# Patient Record
Sex: Female | Born: 2005 | Race: Black or African American | Hispanic: No | Marital: Single | Smoking: Never smoker
Health system: Southern US, Community
[De-identification: ages and names within clinical notes are randomized; demographics above are authoritative.]

## PROBLEM LIST (undated history)

## (undated) ENCOUNTER — Inpatient Hospital Stay: Payer: Self-pay

## (undated) DIAGNOSIS — R519 Headache, unspecified: Secondary | ICD-10-CM

## (undated) DIAGNOSIS — Z789 Other specified health status: Secondary | ICD-10-CM

## (undated) DIAGNOSIS — B999 Unspecified infectious disease: Secondary | ICD-10-CM

## (undated) HISTORY — DX: Other specified health status: Z78.9

## (undated) HISTORY — PX: OTHER SURGICAL HISTORY: SHX169

## (undated) HISTORY — DX: Headache, unspecified: R51.9

## (undated) HISTORY — DX: Unspecified infectious disease: B99.9

## (undated) HISTORY — PX: NO PAST SURGERIES: SHX2092

---
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
## 2024-01-02 ENCOUNTER — Ambulatory Visit: Admitting: Physician Assistant

## 2024-01-02 VITALS — BP 122/69 | HR 104 | Temp 97.1°F | Wt 134.4 lb

## 2024-01-02 DIAGNOSIS — Z3483 Encounter for supervision of other normal pregnancy, third trimester: Secondary | ICD-10-CM

## 2024-01-02 DIAGNOSIS — Z3A29 29 weeks gestation of pregnancy: Secondary | ICD-10-CM

## 2024-01-02 DIAGNOSIS — O99013 Anemia complicating pregnancy, third trimester: Secondary | ICD-10-CM

## 2024-01-02 NOTE — Progress Notes (Signed)
Patient here for MH RV at 29 weeks. Burt Knack, RN

## 2024-01-02 NOTE — Progress Notes (Signed)
  [facility] [address] [place] [address] Main phone: [phone]  Prenatal Visit  Subjective:  Robin Silva is a 18 y.o. G2P1001 at [redacted]w[redacted]d being seen today for ongoing prenatal care.  She is currently monitored for the following issues for this low-risk pregnancy:   Patient Active Problem List   Diagnosis Date Noted   Positive depression screening 12/19/2023   Bilateral ovarian simple cysts on 08/25/23 u/s 6.6 cm 11/18/2023   Prenatal care, subsequent pregnancy 11/12/2023   Pica ice 11/12/2023   Late prenatal care 21 5/7 11/12/2023   UTI (urinary tract infection) during pregnancy 08/25/23 E. Coli 11/12/2023   Anemia affecting pregnancy 11/12/2023   Postpartum hemorrhage EBL 700 mL 07/25/22 09/04/2022   Patient reports no complaints.  Contractions: Not present. Vag. Bleeding: None.  Movement: Present. Denies leaking of fluid/ROM.   The following portions of the patient's history were reviewed and updated as appropriate: allergies, current medications, past family history, past medical history, past social history, past surgical history and problem list. Problem list updated.  Objective:   Vitals:   01/02/24 1125  BP: 122/69  Pulse: (!) 104  Temp: (!) 97.1 F (36.2 C)  Weight: 134 lb 6.4 oz (61 kg)    Fetal Status: Fetal Heart Rate (bpm): 148 Fundal Height: 27 cm Movement: Present     General:  Alert, oriented and cooperative. Patient is in no acute distress.  Skin: Skin is warm and dry. No rash noted.   Cardiovascular: Normal heart rate noted  Respiratory: Normal respiratory effort, no problems with respiration noted  Abdomen: Soft, gravid, appropriate for gestational age.  Pain/Pressure: Absent     Pelvic: Cervical exam deferred        Extremities: Normal range of motion.  Edema: None  Mental Status: Normal mood and affect. Normal behavior. Normal judgment and thought content.   Assessment and  Plan:  Pregnancy: G2P1001 at [redacted]w[redacted]d  1. [redacted] weeks gestation of pregnancy RV in 2 weeks.  2. Prenatal care, subsequent pregnancy in third trimester (Primary) Reviewed 11/15/23 fetal anat scan with pt. Pt desires [place] to complete views of cord insertion, bilat hands, L leg. [place] ordered to complete these views. - US OB Comp + 14 Wk; Future  3. Anemia affecting pregnancy in third trimester Continue oral iron supplement and iron-rich foods. Plan to recheck hgb at RV.   Preterm labor symptoms and general obstetric precautions including but not limited to vaginal bleeding, contractions, leaking of fluid and fetal movement were reviewed in detail with the patient. Please refer to After Visit Summary for other counseling recommendations.  Return in about 2 weeks (around 01/16/2024) for Routine prenatal care.  Future Appointments  Date Time Provider Department Center  01/16/2024  1:40 PM [facility] PROVIDER [facility] None    Landry Dyke, PA-C

## 2024-01-16 ENCOUNTER — Ambulatory Visit: Admitting: Family Medicine

## 2024-01-16 ENCOUNTER — Encounter: Payer: Self-pay | Admitting: Family Medicine

## 2024-01-16 VITALS — BP 91/48 | HR 89 | Temp 97.5°F | Wt 134.4 lb

## 2024-01-16 DIAGNOSIS — Z3A31 31 weeks gestation of pregnancy: Secondary | ICD-10-CM

## 2024-01-16 DIAGNOSIS — O99013 Anemia complicating pregnancy, third trimester: Secondary | ICD-10-CM

## 2024-01-16 DIAGNOSIS — Z3483 Encounter for supervision of other normal pregnancy, third trimester: Secondary | ICD-10-CM

## 2024-01-16 LAB — HEMOGLOBIN, FINGERSTICK: Hemoglobin: 9.1 g/dL — ABNORMAL LOW (ref 11.1–15.9)

## 2024-01-16 NOTE — Progress Notes (Signed)
Patient here for MH RV at 31 weeks. Kick counts reviewed and cards given.Burt Knack, RN

## 2024-01-16 NOTE — Progress Notes (Signed)
  [facility] [address], [address] [place] [address] Main phone: [phone]  Prenatal Visit  Subjective:  Robin Silva is a 18 y.o. G2P1001 at [redacted]w[redacted]d being seen today for ongoing prenatal care.  She is currently monitored for the following issues for this low-risk pregnancy:   Patient Active Problem List   Diagnosis Date Noted   Positive depression screening 12/19/2023   Bilateral ovarian simple cysts on 08/25/23 u/s 6.6 cm 11/18/2023   Prenatal care, subsequent pregnancy 11/12/2023   Pica ice 11/12/2023   Late prenatal care 21 5/7 11/12/2023   UTI (urinary tract infection) during pregnancy 08/25/23 E. Coli 11/12/2023   Anemia affecting pregnancy 11/12/2023   Postpartum hemorrhage EBL 700 mL 07/25/22 09/04/2022   Patient reports no complaints.  Contractions: Irregular. Vag. Bleeding: None.  Movement: Present. Denies leaking of fluid/ROM.   The following portions of the patient's history were reviewed and updated as appropriate: allergies, current medications, past family history, past medical history, past social history, past surgical history and problem list. Problem list updated.  Objective:   Vitals:   01/16/24 1345  BP: (!) 91/48  Pulse: 89  Temp: (!) 97.5 F (36.4 C)  Weight: 134 lb 6.4 oz (61 kg)    Fetal Status: Fetal Heart Rate (bpm): 148 Fundal Height: 29 cm Movement: Present     General:  Alert, oriented and cooperative. Patient is in no acute distress.  Skin: Skin is warm and dry. No rash noted.   Cardiovascular: Normal heart rate noted  Respiratory: Normal respiratory effort, no problems with respiration noted  Abdomen: Soft, gravid, appropriate for gestational age.  Pain/Pressure: Absent     Pelvic: Cervical exam deferred        Extremities: Normal range of motion.  Edema: None  Mental Status: Normal mood and affect. Normal behavior. Normal judgment and thought content.   Assessment and  Plan:  Pregnancy: G2P1001 at [redacted]w[redacted]d  1. [redacted] weeks gestation of pregnancy -follow up US not scheduled- RN to schedule with patient today   Signs and symptoms of preeclampsia were verbally reviewed with warning signs, when and how to call. A written handout with tips on how to take your blood pressure, as well as warning signs was provided to the patient.    2. Prenatal care, subsequent pregnancy in third trimester (Primary) -taking PNV daily  -reports very strong braxton hicks contractions- counseled on comfort measures   3. Anemia affecting pregnancy in third trimester -repeat Hgb today = 9.1 -Heme consult placed today     Preterm labor symptoms and general obstetric precautions including but not limited to vaginal bleeding, contractions, leaking of fluid and fetal movement were reviewed in detail with the patient. Please refer to After Visit Summary for other counseling recommendations.  Return in about 2 weeks (around 01/30/2024) for Routine Prenatal Care.  No future appointments.  Lenice Llamas, [place]

## 2024-01-21 ENCOUNTER — Inpatient Hospital Stay

## 2024-01-21 ENCOUNTER — Encounter: Payer: Self-pay | Admitting: Oncology

## 2024-01-21 ENCOUNTER — Inpatient Hospital Stay: Attending: Oncology | Admitting: Oncology

## 2024-01-21 VITALS — BP 96/62 | HR 118 | Temp 98.4°F | Resp 16 | Ht 64.06 in | Wt 137.0 lb

## 2024-01-21 DIAGNOSIS — D509 Iron deficiency anemia, unspecified: Secondary | ICD-10-CM | POA: Diagnosis present

## 2024-01-21 DIAGNOSIS — R635 Abnormal weight gain: Secondary | ICD-10-CM | POA: Insufficient documentation

## 2024-01-21 DIAGNOSIS — D649 Anemia, unspecified: Secondary | ICD-10-CM

## 2024-01-21 DIAGNOSIS — Z331 Pregnant state, incidental: Secondary | ICD-10-CM | POA: Insufficient documentation

## 2024-01-21 DIAGNOSIS — O99013 Anemia complicating pregnancy, third trimester: Secondary | ICD-10-CM

## 2024-01-21 DIAGNOSIS — Z79899 Other long term (current) drug therapy: Secondary | ICD-10-CM | POA: Insufficient documentation

## 2024-01-21 LAB — CBC WITH DIFFERENTIAL/PLATELET
Abs Immature Granulocytes: 0.09 10*3/uL — ABNORMAL HIGH (ref 0.00–0.07)
Basophils Absolute: 0 10*3/uL (ref 0.0–0.1)
Basophils Relative: 1 %
Eosinophils Absolute: 0.1 10*3/uL (ref 0.0–0.5)
Eosinophils Relative: 2 %
HCT: 31.2 % — ABNORMAL LOW (ref 36.0–46.0)
Hemoglobin: 10.3 g/dL — ABNORMAL LOW (ref 12.0–15.0)
Immature Granulocytes: 2 %
Lymphocytes Relative: 24 %
Lymphs Abs: 1.5 10*3/uL (ref 0.7–4.0)
MCH: 30.9 pg (ref 26.0–34.0)
MCHC: 33 g/dL (ref 30.0–36.0)
MCV: 93.7 fL (ref 80.0–100.0)
Monocytes Absolute: 0.9 10*3/uL (ref 0.1–1.0)
Monocytes Relative: 14 %
Neutro Abs: 3.6 10*3/uL (ref 1.7–7.7)
Neutrophils Relative %: 57 %
Platelets: 156 10*3/uL (ref 150–400)
RBC: 3.33 MIL/uL — ABNORMAL LOW (ref 3.87–5.11)
RDW: 12.3 % (ref 11.5–15.5)
WBC: 6.1 10*3/uL (ref 4.0–10.5)
nRBC: 0 % (ref 0.0–0.2)

## 2024-01-21 LAB — IRON AND TIBC
Iron: 94 ug/dL (ref 28–170)
Saturation Ratios: 17 % (ref 10.4–31.8)
TIBC: 567 ug/dL — ABNORMAL HIGH (ref 250–450)
UIBC: 473 ug/dL

## 2024-01-21 LAB — FERRITIN: Ferritin: 6 ng/mL — ABNORMAL LOW (ref 11–307)

## 2024-01-21 NOTE — Progress Notes (Unsigned)
 Iron deficiency Anemia in pregnancy. Due date is June 7th.

## 2024-01-21 NOTE — Progress Notes (Unsigned)
 [facility]  Telephone:([phone] [phone]  ID: Robin Silva OB: 2006-01-21  MR#: [id number]  HKV#:[id number]  Patient Care Team: Pcp, No as PCP - General Orlie Dakin Tollie Pizza, MD as Consulting Physician (Oncology)  CHIEF COMPLAINT: Iron deficiency anemia in pregnancy.  INTERVAL HISTORY: Patient is an 18 year old female with chronic anemia who is in the third trimester of her second pregnancy.  Patient states she had iron deficiency with her first pregnancy last year, but did not receive any iron infusions at [facility].  She currently feels well.  She has no neurologic complaints.  She denies any recent fevers or illnesses.  She does not complain of any weakness or fatigue.  She has no chest pain, shortness of breath, cough, or hemoptysis.  She is gaining weight appropriately and denies any nausea, vomiting, constipation, or diarrhea.  She has no urinary complaints.  Patient offers no further specific complaints today.  REVIEW OF SYSTEMS:   Review of Systems  Constitutional: Negative.  Negative for fever, malaise/fatigue and weight loss.  Respiratory: Negative.  Negative for cough, hemoptysis and shortness of breath.   Cardiovascular: Negative.  Negative for chest pain and leg swelling.  Gastrointestinal: Negative.  Negative for abdominal pain.  Genitourinary: Negative.  Negative for dysuria.  Musculoskeletal: Negative.  Negative for back pain.  Skin: Negative.  Negative for rash.  Neurological: Negative.  Negative for dizziness, focal weakness, weakness and headaches.  Psychiatric/Behavioral: Negative.  The patient is not nervous/anxious.     As per HPI. Otherwise, a complete review of systems is negative.  PAST MEDICAL HISTORY: Past Medical History:  Diagnosis Date   Anemia of mother in pregnancy, antepartum 04/02/2022   Chlamydia infection affecting pregnancy, antepartum 04/02/2022   Diagnosed at [facility] ED 01/25/22  [x]   Treated [facility] ED 01/26/22  [x]    Retested at [facility] initial appt 04/02/22   Gonorrhea in pregnancy, antepartum 04/02/2022   Diagnosed at [facility] ED  [x]   Treated 01/26/22 [facility] ED  [x]   Re-tested at [facility] initial appt 04/02/22 = neg      Headache    Infection    Late prenatal care affecting pregnancy, antepartum @ 21  wks 04/02/2022   Medical history non-contributory    Pica ice 3x/wk 04/30/2022   Supervision of high-risk pregnancy, unspecified trimester 04/02/2022          Nursing Staff  Provider  Office Location   [facility]  Dating   LMP; on 01/25/22@12  2/7=08/07/22  Language   English  Anatomy US   On 04/05/22@21  5/7=08/13/22  Flu Vaccine   Declined 04/02/22  Genetic Screen   NIPS: 04/02/22=neg  AFPonly: 04/02/22=neg:   First Screen:  Quad:      TDaP vaccine    Declined 05/14/22  Hgb A1C or   GTT  Early   Third trimester   COVID vaccine  Declined 04/02/22        [place]    PAST SURGICAL HISTORY: Past Surgical History:  Procedure Laterality Date   Denies surgical history     NO PAST SURGERIES      FAMILY HISTORY: Family History  Problem Relation Age of Onset   Healthy Mother    Healthy Father    Hypertension Maternal Aunt    Stroke Maternal Grandmother    Healthy Maternal Grandfather    Healthy Paternal Grandmother    Asthma Half-Brother    Healthy Half-Brother    Asthma Half-Sister    Healthy Half-Sister    Asthma Half-Sister  Healthy Half-Sister     ADVANCED DIRECTIVES (Y/N):  N  HEALTH MAINTENANCE: Social History   Tobacco Use   Smoking status: Never    Passive exposure: Past   Smokeless tobacco: Never   Tobacco comments:    Denies secondhand smoke exposure.  Vaping Use   Vaping status: Never Used  Substance Use Topics   Alcohol use: Never   Drug use: Not Currently    Types: Marijuana    Comment: Last marijuana use was in 2022.     Colonoscopy:  PAP:  Bone density:  Lipid panel:  No Known Allergies  Current Outpatient Medications  Medication Sig Dispense Refill   prenatal vitamin w/FE, FA (NATACHEW)  29-1 MG CHEW chewable tablet Chew 1 tablet by mouth daily at 12 noon.     cephALEXin (KEFLEX) 500 MG capsule Take 1 capsule (500 mg total) by mouth 4 (four) times daily. (Patient not taking: Reported on 11/12/2023) 40 capsule 0   ibuprofen (ADVIL) 800 MG tablet Take 800 mg by mouth every 8 (eight) hours as needed for cramping. (Patient not taking: Reported on 11/12/2023)     Iron, Ferrous Sulfate, 325 (65 Fe) MG TABS Take 1 tablet by mouth daily at 6 (six) AM. (Patient not taking: Reported on 01/21/2024) 100 tablet    No current facility-administered medications for this visit.    OBJECTIVE: Vitals:   01/21/24 1520  BP: 96/62  Pulse: (!) 118  Resp: 16  Temp: 98.4 F (36.9 C)  SpO2: 100%     Body mass index is 23.47 kg/m.    ECOG FS:0 - Asymptomatic  General: Well-developed, well-nourished, no acute distress. Eyes: Pink conjunctiva, anicteric sclera. HEENT: Normocephalic, moist mucous membranes. Lungs: No audible wheezing or coughing. Heart: Regular rate and rhythm. Abdomen: Appears appropriate for gestational age. Musculoskeletal: No edema, cyanosis, or clubbing. Neuro: Alert, answering all questions appropriately. Cranial nerves grossly intact. Skin: No rashes or petechiae noted. Psych: Normal affect. Lymphatics: No cervical, calvicular, axillary or inguinal LAD.   LAB RESULTS:  Lab Results  Component Value Date   NA 134 (L) 08/25/2023   K 3.3 (L) 08/25/2023   CL 102 08/25/2023   CO2 24 08/25/2023   GLUCOSE 107 (H) 08/25/2023   BUN 7 08/25/2023   CREATININE 0.60 08/25/2023   CALCIUM 9.0 08/25/2023   PROT 8.3 (H) 08/25/2023   ALBUMIN 4.3 08/25/2023   AST 14 (L) 08/25/2023   ALT 9 08/25/2023   ALKPHOS 43 08/25/2023   BILITOT 1.7 (H) 08/25/2023   GFRNONAA >60 08/25/2023    Lab Results  Component Value Date   WBC 6.1 01/21/2024   NEUTROABS 3.6 01/21/2024   HGB 10.3 (L) 01/21/2024   HCT 31.2 (L) 01/21/2024   MCV 93.7 01/21/2024   PLT 156 01/21/2024   Lab Results   Component Value Date   IRON 94 01/21/2024   TIBC 567 (H) 01/21/2024   IRONPCTSAT 17 01/21/2024   Lab Results  Component Value Date   FERRITIN 6 (L) 01/21/2024     STUDIES: No results found.  ASSESSMENT: Iron deficiency anemia in pregnancy.  PLAN:    Iron deficiency anemia in pregnancy: Patient is currently in her third trimester.  Hemoglobin and iron stores are reduced.  Will proceed with 200 mg IV Venofer 5 times over the next 1 to 2 weeks.  Patient will then return to clinic the first week of June prior to her due date for repeat laboratory work, further evaluation, and continuation of treatment if needed. Pregnancy:  Patient reports that she is having a baby girl and due date is March 21, 2024.  I spent a total of 45 minutes reviewing chart data, face-to-face evaluation with the patient, counseling and coordination of care as detailed above.   Patient expressed understanding and was in agreement with this plan. She also understands that She can call clinic at any time with any questions, concerns, or complaints.    Jeralyn Ruths, MD   01/23/2024 11:10 AM

## 2024-01-22 ENCOUNTER — Other Ambulatory Visit: Payer: Self-pay | Admitting: Nurse Practitioner

## 2024-01-22 ENCOUNTER — Inpatient Hospital Stay

## 2024-01-22 VITALS — BP 99/56 | HR 84 | Temp 96.5°F | Resp 17

## 2024-01-22 DIAGNOSIS — O99013 Anemia complicating pregnancy, third trimester: Secondary | ICD-10-CM

## 2024-01-22 DIAGNOSIS — D509 Iron deficiency anemia, unspecified: Secondary | ICD-10-CM | POA: Diagnosis not present

## 2024-01-22 MED ORDER — SODIUM CHLORIDE 0.9% FLUSH
10.0000 mL | Freq: Once | INTRAVENOUS | Status: AC
  Administered 2024-01-22: 10 mL via INTRAVENOUS
  Filled 2024-01-22: qty 10

## 2024-01-22 MED ORDER — IRON SUCROSE 20 MG/ML IV SOLN
200.0000 mg | Freq: Once | INTRAVENOUS | Status: AC
  Administered 2024-01-22: 200 mg via INTRAVENOUS
  Filled 2024-01-22: qty 10

## 2024-01-22 NOTE — Patient Instructions (Signed)

## 2024-01-22 NOTE — Progress Notes (Signed)
 Patient tolerated initail Venofer infusion well, no questions/concerns voiced. Monitored 30 min post transfusion. Patient stable at discharge. VSS. AVS given.

## 2024-01-23 ENCOUNTER — Encounter: Payer: Self-pay | Admitting: Oncology

## 2024-01-23 NOTE — Progress Notes (Unsigned)
 01-23-2024. Received a request for medical records from [employer] on behalf of [employer] on patient Robin Silva, DOB: 08/29/2006 for HEDIS purposes. Records requested for measure time frame 10-15-2021 to 10-15-2023. On 12-09-2023 faxed [facility] Attestation form to [employer] at [phone]. On 01-23-2024 no response from above fax. Clarified with [facility] county attorney Attestation for release of reproductive health records not needed for HEDIS  purposes. Telephone call to [employer]. Per [employer] clerk, project was cancelled, records no longer needed therefore [facility] records not released.  Herby Abraham RN.

## 2024-01-24 ENCOUNTER — Ambulatory Visit: Admission: RE | Admit: 2024-01-24 | Source: Ambulatory Visit

## 2024-01-28 ENCOUNTER — Inpatient Hospital Stay

## 2024-01-28 VITALS — BP 109/58 | HR 102 | Temp 95.1°F | Resp 19

## 2024-01-28 DIAGNOSIS — D509 Iron deficiency anemia, unspecified: Secondary | ICD-10-CM | POA: Diagnosis not present

## 2024-01-28 DIAGNOSIS — O99013 Anemia complicating pregnancy, third trimester: Secondary | ICD-10-CM

## 2024-01-28 MED ORDER — IRON SUCROSE 20 MG/ML IV SOLN
200.0000 mg | Freq: Once | INTRAVENOUS | Status: AC
  Administered 2024-01-28: 200 mg via INTRAVENOUS

## 2024-01-28 NOTE — Progress Notes (Signed)
 [facility] Clinical Social Work  Clinical Social Work met with patient while rounding in infusion to assess psychosocial needs.  Clinical Social Worker offered support and provided contact information.  Per RN, patient has one child and is currently pregnant with her second.  Patient displayed a bright affect and stated all of her needs were met.     Kennth Peal, LCSW  Clinical Social Worker [facility]        Patient is participating in a Managed Medicaid Plan:  Yes

## 2024-01-30 ENCOUNTER — Encounter: Admitting: Family Medicine

## 2024-01-30 ENCOUNTER — Telehealth: Payer: Self-pay

## 2024-01-30 NOTE — Progress Notes (Signed)
 Erroneous- disregard

## 2024-01-30 NOTE — Telephone Encounter (Signed)
 DNKA for [facility] RV 01/30/24 and call made to client to reschedule missed appt. Number to call provided. Ariel Begun, RN

## 2024-01-30 NOTE — Telephone Encounter (Signed)
 Per Josepha Nickels, she rescheduled client for 02/03/24. Ariel Begun, RN

## 2024-01-31 ENCOUNTER — Encounter: Payer: Self-pay | Admitting: Physician Assistant

## 2024-01-31 ENCOUNTER — Inpatient Hospital Stay

## 2024-01-31 VITALS — BP 101/53 | HR 109 | Temp 98.4°F | Resp 18

## 2024-01-31 DIAGNOSIS — O99013 Anemia complicating pregnancy, third trimester: Secondary | ICD-10-CM

## 2024-01-31 DIAGNOSIS — D509 Iron deficiency anemia, unspecified: Secondary | ICD-10-CM | POA: Diagnosis not present

## 2024-01-31 MED ORDER — IRON SUCROSE 20 MG/ML IV SOLN
200.0000 mg | Freq: Once | INTRAVENOUS | Status: AC
  Administered 2024-01-31: 200 mg via INTRAVENOUS
  Filled 2024-01-31: qty 10

## 2024-01-31 MED ORDER — SODIUM CHLORIDE 0.9% FLUSH
10.0000 mL | Freq: Once | INTRAVENOUS | Status: AC
  Administered 2024-01-31: 10 mL via INTRAVENOUS
  Filled 2024-01-31: qty 10

## 2024-02-03 ENCOUNTER — Telehealth: Payer: Self-pay

## 2024-02-03 ENCOUNTER — Ambulatory Visit

## 2024-02-03 NOTE — Telephone Encounter (Signed)
 Phone call to patient at [phone] to reschedule today's (4/21) missed MH RV appt. No answer left message with contact number to return our call. Eden Goodpasture, RN

## 2024-02-04 ENCOUNTER — Inpatient Hospital Stay

## 2024-02-04 ENCOUNTER — Ambulatory Visit
Admission: RE | Admit: 2024-02-04 | Discharge: 2024-02-04 | Disposition: A | Discharge status: Home / Self Care | Source: Ambulatory Visit | Attending: Physician Assistant | Admitting: Physician Assistant

## 2024-02-04 VITALS — BP 107/67 | HR 92 | Resp 16

## 2024-02-04 DIAGNOSIS — D509 Iron deficiency anemia, unspecified: Secondary | ICD-10-CM | POA: Diagnosis not present

## 2024-02-04 DIAGNOSIS — Z362 Encounter for other antenatal screening follow-up: Secondary | ICD-10-CM | POA: Diagnosis not present

## 2024-02-04 DIAGNOSIS — O99013 Anemia complicating pregnancy, third trimester: Secondary | ICD-10-CM

## 2024-02-04 DIAGNOSIS — Z3483 Encounter for supervision of other normal pregnancy, third trimester: Secondary | ICD-10-CM | POA: Insufficient documentation

## 2024-02-04 DIAGNOSIS — Z3689 Encounter for other specified antenatal screening: Secondary | ICD-10-CM | POA: Diagnosis not present

## 2024-02-04 DIAGNOSIS — Z3A33 33 weeks gestation of pregnancy: Secondary | ICD-10-CM | POA: Diagnosis not present

## 2024-02-04 MED ORDER — IRON SUCROSE 20 MG/ML IV SOLN
200.0000 mg | Freq: Once | INTRAVENOUS | Status: AC
  Administered 2024-02-04: 200 mg via INTRAVENOUS
  Filled 2024-02-04: qty 10

## 2024-02-04 NOTE — Telephone Encounter (Signed)
 Phone call to patient at [phone] to reschedule missed MH RV appt. No answer left message with contact number to return our call. Gery Kubas RN

## 2024-02-06 ENCOUNTER — Inpatient Hospital Stay

## 2024-02-06 VITALS — BP 93/55 | HR 100 | Temp 98.1°F | Resp 18

## 2024-02-06 DIAGNOSIS — O99013 Anemia complicating pregnancy, third trimester: Secondary | ICD-10-CM

## 2024-02-06 DIAGNOSIS — D509 Iron deficiency anemia, unspecified: Secondary | ICD-10-CM | POA: Diagnosis not present

## 2024-02-06 MED ORDER — SODIUM CHLORIDE 0.9% FLUSH
10.0000 mL | Freq: Once | INTRAVENOUS | Status: AC
  Administered 2024-02-06: 10 mL via INTRAVENOUS
  Filled 2024-02-06: qty 10

## 2024-02-06 MED ORDER — IRON SUCROSE 20 MG/ML IV SOLN
200.0000 mg | Freq: Once | INTRAVENOUS | Status: AC
  Administered 2024-02-06: 200 mg via INTRAVENOUS
  Filled 2024-02-06: qty 10

## 2024-02-10 NOTE — Telephone Encounter (Signed)
 Appointment scheduled per [facility] on 02/11/2024. Gery Kubas RN

## 2024-02-11 ENCOUNTER — Ambulatory Visit

## 2024-02-13 ENCOUNTER — Encounter: Payer: Self-pay | Admitting: Family Medicine

## 2024-02-13 NOTE — Progress Notes (Signed)
 Reviewed f/u anatomy scan. EFW 39th %ile. Normal anatomy viewed. No follow up indicated.   Tempie Fee, MD 02/13/24  4:12 PM

## 2024-02-14 ENCOUNTER — Other Ambulatory Visit: Payer: Self-pay

## 2024-02-14 ENCOUNTER — Observation Stay
Admission: EM | Admit: 2024-02-14 | Discharge: 2024-02-14 | Disposition: A | Discharge status: Home / Self Care | Attending: Obstetrics | Admitting: Obstetrics

## 2024-02-14 ENCOUNTER — Encounter: Payer: Self-pay | Admitting: Obstetrics

## 2024-02-14 ENCOUNTER — Ambulatory Visit: Admitting: Nurse Practitioner

## 2024-02-14 VITALS — BP 111/69 | HR 109 | Temp 98.2°F | Wt 143.2 lb

## 2024-02-14 DIAGNOSIS — Z3483 Encounter for supervision of other normal pregnancy, third trimester: Principal | ICD-10-CM

## 2024-02-14 DIAGNOSIS — O4193X0 Disorder of amniotic fluid and membranes, unspecified, third trimester, not applicable or unspecified: Secondary | ICD-10-CM | POA: Insufficient documentation

## 2024-02-14 DIAGNOSIS — O99891 Other specified diseases and conditions complicating pregnancy: Secondary | ICD-10-CM | POA: Diagnosis not present

## 2024-02-14 DIAGNOSIS — O99820 Streptococcus B carrier state complicating pregnancy: Secondary | ICD-10-CM | POA: Diagnosis not present

## 2024-02-14 DIAGNOSIS — N898 Other specified noninflammatory disorders of vagina: Secondary | ICD-10-CM

## 2024-02-14 DIAGNOSIS — O4703 False labor before 37 completed weeks of gestation, third trimester: Secondary | ICD-10-CM | POA: Diagnosis present

## 2024-02-14 DIAGNOSIS — Z3A35 35 weeks gestation of pregnancy: Secondary | ICD-10-CM | POA: Insufficient documentation

## 2024-02-14 DIAGNOSIS — O99013 Anemia complicating pregnancy, third trimester: Secondary | ICD-10-CM

## 2024-02-14 LAB — WET PREP, GENITAL
Clue Cells Wet Prep HPF POC: NONE SEEN
Sperm: NONE SEEN
Trich, Wet Prep: NONE SEEN
WBC, Wet Prep HPF POC: <10 (ref <10)
Yeast Wet Prep HPF POC: NONE SEEN

## 2024-02-14 LAB — GROUP B STREP BY PCR: Group B strep by PCR: POSITIVE — AB

## 2024-02-14 LAB — CHLAMYDIA/NGC RT PCR (ARMC ONLY)
Chlamydia Tr: NOT DETECTED
N gonorrhoeae: NOT DETECTED

## 2024-02-14 LAB — RUPTURE OF MEMBRANE (ROM)PLUS: Rom Plus: NEGATIVE

## 2024-02-14 MED ORDER — HYDROXYZINE HCL 25 MG PO TABS: 50.0000 mg | ORAL_TABLET | Freq: Four times a day (QID) | ORAL | Status: DC | PRN

## 2024-02-14 MED ORDER — LACTATED RINGERS IV SOLN
500.0000 mL | INTRAVENOUS | Status: DC | PRN
  Administered 2024-02-14: 1000 mL via INTRAVENOUS

## 2024-02-14 MED ORDER — ACETAMINOPHEN 325 MG PO TABS: 650.0000 mg | ORAL_TABLET | ORAL | Status: DC | PRN

## 2024-02-14 MED ORDER — ONDANSETRON HCL 4 MG/2ML IJ SOLN: 4.0000 mg | Freq: Four times a day (QID) | INTRAMUSCULAR | Status: DC | PRN

## 2024-02-14 MED ORDER — ONDANSETRON 4 MG PO TBDP: 4.0000 mg | ORAL_TABLET | Freq: Three times a day (TID) | ORAL | 0 refills | Status: DC | PRN

## 2024-02-14 MED ORDER — SOD CITRATE-CITRIC ACID 500-334 MG/5ML PO SOLN: 30.0000 mL | ORAL | Status: DC | PRN

## 2024-02-14 NOTE — Progress Notes (Signed)
 [facility] [address] [place] [address] Main phone: [phone]  Prenatal Visit  Subjective:  Robin Silva is a 18 y.o. G2P1001 at [redacted]w[redacted]d being seen today for ongoing prenatal care.  She is currently monitored for the following issues for this low-risk pregnancy:   Patient Active Problem List   Diagnosis Date Noted   Positive depression screening 12/19/2023   Bilateral ovarian simple cysts on 08/25/23 u/s 6.6 cm 11/18/2023   Prenatal care, subsequent pregnancy 11/12/2023   Pica ice 11/12/2023   Late prenatal care 21 5/7 11/12/2023   UTI (urinary tract infection) during pregnancy 08/25/23 E. Coli 11/12/2023   Anemia affecting pregnancy 11/12/2023   Postpartum hemorrhage EBL 700 mL 07/25/22 09/04/2022   Patient reports contractions since last week occasionally .  Contractions: Irregular. Vag. Bleeding: None.  Movement: Present. She reports leaking watery fluid consistently for the past week as well. She denies malodor or fever. She states that her contractions are painful and irregular, but sometimes coming back to back. She reports a consistent headache for the past week and allergies that she takes OTC meds for.   The following portions of the patient's history were reviewed and updated as appropriate: allergies, current medications, past family history, past medical history, past social history, past surgical history and problem list. Problem list updated.  Objective:   Vitals:   02/14/24 1529  BP: 111/69  Pulse: (!) 109  Temp: 98.2 F (36.8 C)  Weight: 143 lb 3.2 oz (65 kg)    Fetal Status: Fetal Heart Rate (bpm): 130 Fundal Height: 35 cm Movement: Present  Presentation: Vertex  General:  Alert, oriented and cooperative. Patient is in no acute distress.  Skin: Skin is warm and dry. No rash noted.   Cardiovascular: Normal heart rate noted  Respiratory: Normal respiratory effort, no problems with  respiration noted  Abdomen: Soft, gravid, appropriate for gestational age.  Pain/Pressure: Present     Pelvic: Cervical exam deferred        Extremities: Normal range of motion.  Edema: None  Mental Status: Normal mood and affect. Normal behavior. Normal judgment and thought content.   Assessment and Plan:  Pregnancy: G2P1001 at [redacted]w[redacted]d  1. Prenatal care, subsequent pregnancy in third trimester (Primary) 33 lb 3.2 oz (15.1 kg) Gained 9 lbs in 4 weeks Pt had f/u anatomy US  on 4/22 that saw everything except suboptimal views of the left hand. Pt is okay with this and does not want a f/u US   Given pt may possibly be leaking fluid and contracting recommended pt to present to L&D immediately. Called [facility] L&D charge nurse and let her know the pt is on their way for possible ROM.  2. Anemia affecting pregnancy in third trimester 01/21/24- seen by Heme- will have 200mg  IV Venofer  x 5 over 1-2 weeks -repeat labs with Heme 1st week of June  3. [redacted] weeks gestation of pregnancy    Preterm labor symptoms and general obstetric precautions including but not limited to vaginal bleeding, contractions, leaking of fluid and fetal movement were reviewed in detail with the patient. Please refer to After Visit Summary for other counseling recommendations.  Return in about 1 week (around 02/21/2024) for Routine PNC, 36 wk labs.  Future Appointments  Date Time Provider Department Center  02/21/2024 10:40 AM [facility] PROVIDER [facility] None  03/16/2024 10:30 AM [facility] LAB [facility] None  03/17/2024 10:30 AM Shellie Dials, MD [facility] None  03/17/2024 11:00 AM  [facility] INFUSION CHAIR 9 [facility] None    Dorna Gasman, NP

## 2024-02-14 NOTE — OB Triage Note (Signed)
 Pt given discharge instructions about preterm labor/ labor signs and symptoms. Pt verbalizes understanding. Vaginal bleeding and discharge, contractions, and fetal movement reviewed by RN. Follow-up care reviewed. Pt discharged home with father.

## 2024-02-14 NOTE — Progress Notes (Signed)
 Patient states performing kick counts and baby moving well, additional cards given. Gery Kubas RN

## 2024-02-14 NOTE — OB Triage Note (Signed)
 Pt is a 17yo G2P1, 35w 1d. She arrived to the unit with complaints of LOF, she states that she felt a gush last Friday that woke her out of sleep and ever since has leaked clear fluid. She states that she has experienced n/v today and has not been able to keep anything down. She denies vaginal bleeding, reports positive fetal movement, and reports contractions every 20ish minutes that are 7/10 on pain scale. VS stable, monitors applied and assessing. Initial FHT 150 at 1655.

## 2024-02-14 NOTE — OB Triage Note (Signed)
 LABOR & DELIVERY OB TRIAGE NOTE  SUBJECTIVE  HPI Robin Silva is a 18 y.o. G2P1001 at [redacted]w[redacted]d who presents to Labor & Delivery for leaking of fluid and contractions. She reports that last Friday, she woke up with a big gush of fluid. She has continued to leak clear, watery fluid. She is changing a pad every few hours. She endorses good fetal movement. She has been having some contractions today. She also had an episode of nausea and vomiting. She denies vaginal bleeding, fever, diarrhea, and exposure to illness.  OB History     Gravida  2   Para  1   Term  1   Preterm      AB      Living  1      SAB      IAB      Ectopic      Multiple  0   Live Births  1           Scheduled Meds: Continuous Infusions:  lactated ringers  1,000 mL (02/14/24 1743)   PRN Meds:.acetaminophen , hydrOXYzine, lactated ringers , ondansetron , sodium citrate-citric acid   OBJECTIVE  BP 102/67 (BP Location: Right Arm)   Pulse (!) 114   Temp 98.2 F (36.8 C) (Oral)   Resp 18   Ht 5\' 6"  (1.676 m)   Wt 65 kg   LMP 11/17/2022 (Exact Date) Comment: heavy  BMI 23.13 kg/m   General: alert, cooperative, NAD Abdomen: soft, gravid, non-tender SSE: cervix visually closed. Moderate amount of creamy, white discharge.  ROM Plus negative Wet prep negative GBS positive GC/chlamydia pending  NST I reviewed the NST and it was reactive.  Baseline: 135 Variability: moderate Accelerations: present Decelerations:none Toco:  irregular Category 1  ASSESSMENT Impression  1) Pregnancy at G2P1001, [redacted]w[redacted]d, Estimated Date of Delivery: 03/19/24 2) Reassuring maternal/fetal status 3) Membranes intact 4) Contractions spaced after IV fluids  PLAN 1) Discharge home with standard return/labor precautions 2) Will manage GC/chlamydia results as outpatient 3) Keep scheduled ROB appt 4) Rx sent for Zofran   Josue Nip, CNM 02/14/24  6:35 PM

## 2024-02-21 ENCOUNTER — Ambulatory Visit: Admitting: Nurse Practitioner

## 2024-02-21 VITALS — BP 112/69 | HR 98 | Temp 97.6°F | Wt 145.6 lb

## 2024-02-21 DIAGNOSIS — O99013 Anemia complicating pregnancy, third trimester: Secondary | ICD-10-CM

## 2024-02-21 DIAGNOSIS — Z3A36 36 weeks gestation of pregnancy: Secondary | ICD-10-CM

## 2024-02-21 DIAGNOSIS — Z3483 Encounter for supervision of other normal pregnancy, third trimester: Secondary | ICD-10-CM

## 2024-02-21 LAB — HEMOGLOBIN, FINGERSTICK: Hemoglobin: 10 g/dL — ABNORMAL LOW (ref 11.1–15.9)

## 2024-02-21 NOTE — Progress Notes (Signed)
  [facility] [address] [place] [address] Main phone: [phone]  Prenatal Visit  Subjective:  Robin Silva is a 18 y.o. G2P1001 at [redacted]w[redacted]d being seen today for ongoing prenatal care.  She is currently monitored for the following issues for this low-risk pregnancy:   Patient Active Problem List   Diagnosis Date Noted   Positive depression screening 12/19/2023   Bilateral ovarian simple cysts on 08/25/23 u/s 6.6 cm 11/18/2023   Prenatal care, subsequent pregnancy 11/12/2023   Pica ice 11/12/2023   Late prenatal care 21 5/7 11/12/2023   UTI (urinary tract infection) during pregnancy 08/25/23 E. Coli 11/12/2023   Anemia affecting pregnancy 11/12/2023   Postpartum hemorrhage EBL 700 mL 07/25/22 09/04/2022   Patient reports vaginal pressure and Braxton Hicks.  Contractions: Not present. Vag. Bleeding: None.  Movement: Present. Denies leaking of fluid/ROM. Pt reports same amount of discharge as last week. PT c/o HA that is not relived by tylenol  that comes every other day and lasts the whole day. She reports drinking 2-4 bottles of water/day. She is not eating much as she occasionally vomits and has no appetite. She was prescribed zofran  at her hospital visit on 5/2 and she recently picked it up from the pharmacy but hasn't started yet.  The following portions of the patient's history were reviewed and updated as appropriate: allergies, current medications, past family history, past medical history, past social history, past surgical history and problem list. Problem list updated.  Objective:   Vitals:   02/21/24 1057  BP: 112/69  Pulse: 98  Temp: 97.6 F (36.4 C)  Weight: 145 lb 9.6 oz (66 kg)    Fetal Status: Fetal Heart Rate (bpm): 140 Fundal Height: 36 cm Movement: Present     General:  Alert, oriented and cooperative. Patient is in no acute distress.  Skin: Skin is warm and dry. No rash noted.    Cardiovascular: Normal heart rate noted  Respiratory: Normal respiratory effort, no problems with respiration noted  Abdomen: Soft, gravid, appropriate for gestational age.  Pain/Pressure: Present     Pelvic: Cervical exam deferred        Extremities: Normal range of motion.  Edema: None  Mental Status: Normal mood and affect. Normal behavior. Normal judgment and thought content.   Assessment and Plan:  Pregnancy: G2P1001 at [redacted]w[redacted]d  1. Prenatal care, subsequent pregnancy in third trimester (Primary) 35 lb 9.6 oz (16.1 kg) Gained 2 lbs in 1 week  Enc pt to increase water intake and more frequent meals. Also recommended starting OTC magnesium 500mg  nightly for frequent HA. Not pre-e d/t BP WNL.  2. Anemia affecting pregnancy in third trimester Pt receives IV venofer  and has 1 appt left. - Hemoglobin, fingerstick: 10  3. [redacted] weeks gestation of pregnancy Did not collect 36wk labs as this was done during her hosp visit on 5/2. GC/CT negative GBS positive   Preterm labor symptoms and general obstetric precautions including but not limited to vaginal bleeding, contractions, leaking of fluid and fetal movement were reviewed in detail with the patient. Please refer to After Visit Summary for other counseling recommendations.  Return in about 1 week (around 02/28/2024) for Routine PNC.  Future Appointments  Date Time Provider Department Center  03/16/2024 10:30 AM [facility] None  03/17/2024 10:30 AM Shellie Dials, MD [facility] None  03/17/2024 11:00 AM [facility] INFUSION CHAIR 9 [facility] None    Dorna Gasman, NP

## 2024-02-21 NOTE — Progress Notes (Signed)
 In house hemoglobin result reviewed during visit. GBS positive-handout and counseling given.Gery Kubas RN

## 2024-02-26 ENCOUNTER — Other Ambulatory Visit: Payer: Self-pay

## 2024-02-26 ENCOUNTER — Encounter: Payer: Self-pay | Admitting: Oncology

## 2024-02-28 ENCOUNTER — Telehealth: Payer: Self-pay

## 2024-02-28 ENCOUNTER — Ambulatory Visit

## 2024-02-28 NOTE — Telephone Encounter (Signed)
 Per [facility] appt desk, client now has [facility] RV appt scheduled for 03/03/24. Ariel Begun, RN

## 2024-02-28 NOTE — Telephone Encounter (Signed)
 Patient late for return OB appointment. Left message to call if she will be late or needs to reschedule. Number provided. Gery Kubas RN

## 2024-03-03 ENCOUNTER — Ambulatory Visit: Admitting: Family Medicine

## 2024-03-03 VITALS — BP 117/77 | HR 101 | Temp 97.4°F | Wt 145.6 lb

## 2024-03-03 DIAGNOSIS — O99013 Anemia complicating pregnancy, third trimester: Secondary | ICD-10-CM

## 2024-03-03 DIAGNOSIS — O99820 Streptococcus B carrier state complicating pregnancy: Secondary | ICD-10-CM

## 2024-03-03 DIAGNOSIS — Z3A37 37 weeks gestation of pregnancy: Secondary | ICD-10-CM

## 2024-03-03 DIAGNOSIS — Z3483 Encounter for supervision of other normal pregnancy, third trimester: Secondary | ICD-10-CM

## 2024-03-03 NOTE — Progress Notes (Signed)
 Patient here for MH RV at 37 5/7. Aware of next IV venofer  on 03/17/24.Aaron AasRosiland Cooks, RN

## 2024-03-03 NOTE — Progress Notes (Signed)
  [facility] [address] [place] [address] Main phone: [phone]  Prenatal Visit  Subjective:  Robin Silva is a 18 y.o. G2P1001 at [redacted]w[redacted]d being seen today for ongoing prenatal care.  She is currently monitored for the following issues for this low-risk pregnancy:   Patient Active Problem List   Diagnosis Date Noted   GBS (group B Streptococcus carrier), +RV culture, currently pregnant 03/03/2024   Positive depression screening 12/19/2023   Bilateral ovarian simple cysts on 08/25/23 u/s 6.6 cm 11/18/2023   Prenatal care, subsequent pregnancy 11/12/2023   Pica ice 11/12/2023   Late prenatal care 21 5/7 11/12/2023   UTI (urinary tract infection) during pregnancy 08/25/23 E. Coli 11/12/2023   Anemia affecting pregnancy 11/12/2023   Postpartum hemorrhage EBL 700 mL 07/25/22 09/04/2022   Patient reports no complaints.  Contractions: Irregular. Vag. Bleeding: None.  Movement: Present. Denies leaking of fluid/ROM.   The following portions of the patient's history were reviewed and updated as appropriate: allergies, current medications, past family history, past medical history, past social history, past surgical history and problem list. Problem list updated.  Objective:   Vitals:   03/03/24 1026  BP: 117/77  Pulse: (!) 101  Temp: (!) 97.4 F (36.3 C)  Weight: 145 lb 9.6 oz (66 kg)    Fetal Status: Fetal Heart Rate (bpm): 143 Fundal Height: 37 cm Movement: Present  Presentation: Vertex  General:  Alert, oriented and cooperative. Patient is in no acute distress.  Skin: Skin is warm and dry. No rash noted.   Cardiovascular: Normal heart rate noted  Respiratory: Normal respiratory effort, no problems with respiration noted  Abdomen: Soft, gravid, appropriate for gestational age.  Pain/Pressure: Present     Pelvic: Cervical exam deferred        Extremities: Normal range of motion.  Edema: None  Mental  Status: Normal mood and affect. Normal behavior. Normal judgment and thought content.   Assessment and Plan:  Pregnancy: G2P1001 at [redacted]w[redacted]d  1. [redacted] weeks gestation of pregnancy (Primary) -reviewed 36 week labs -GBS pos- counseled on abx during delivery   2. Prenatal care, subsequent pregnancy in third trimester -taking PNV daily  35 lb 9.6 oz (16.1 kg)  3. Anemia affecting pregnancy in third trimester -IV venofer  on 6/3-strongly encouraged to keep appt   Term labor symptoms and general obstetric precautions including but not limited to vaginal bleeding, contractions, leaking of fluid and fetal movement were reviewed in detail with the patient. Please refer to After Visit Summary for other counseling recommendations.  No follow-ups on file.  Future Appointments  Date Time Provider Department Center  03/11/2024 10:20 AM [facility] PROVIDER [facility] None  03/16/2024 10:30 AM [facility] LAB [facility] None  03/17/2024 10:30 AM Shellie Dials, MD [facility] None  03/17/2024 11:00 AM [facility] INFUSION CHAIR 16 [facility] None    Earleen Glazier, [place]

## 2024-03-08 ENCOUNTER — Other Ambulatory Visit: Payer: Self-pay

## 2024-03-08 ENCOUNTER — Inpatient Hospital Stay
Admission: EM | Admit: 2024-03-08 | Discharge: 2024-03-10 | DRG: 807 | Disposition: A | Discharge status: Home / Self Care

## 2024-03-08 ENCOUNTER — Encounter: Payer: Self-pay | Admitting: Obstetrics and Gynecology

## 2024-03-08 DIAGNOSIS — Z3A38 38 weeks gestation of pregnancy: Secondary | ICD-10-CM

## 2024-03-08 DIAGNOSIS — O99344 Other mental disorders complicating childbirth: Secondary | ICD-10-CM

## 2024-03-08 DIAGNOSIS — O99824 Streptococcus B carrier state complicating childbirth: Principal | ICD-10-CM | POA: Diagnosis present

## 2024-03-08 DIAGNOSIS — Z8249 Family history of ischemic heart disease and other diseases of the circulatory system: Secondary | ICD-10-CM | POA: Diagnosis not present

## 2024-03-08 DIAGNOSIS — O9902 Anemia complicating childbirth: Secondary | ICD-10-CM

## 2024-03-08 DIAGNOSIS — O99013 Anemia complicating pregnancy, third trimester: Secondary | ICD-10-CM

## 2024-03-08 DIAGNOSIS — Z3480 Encounter for supervision of other normal pregnancy, unspecified trimester: Secondary | ICD-10-CM

## 2024-03-08 DIAGNOSIS — Z1331 Encounter for screening for depression: Secondary | ICD-10-CM | POA: Diagnosis present

## 2024-03-08 DIAGNOSIS — D649 Anemia, unspecified: Secondary | ICD-10-CM

## 2024-03-08 DIAGNOSIS — O43193 Other malformation of placenta, third trimester: Secondary | ICD-10-CM | POA: Diagnosis present

## 2024-03-08 DIAGNOSIS — F32A Depression, unspecified: Secondary | ICD-10-CM

## 2024-03-08 DIAGNOSIS — O99019 Anemia complicating pregnancy, unspecified trimester: Secondary | ICD-10-CM | POA: Diagnosis present

## 2024-03-08 DIAGNOSIS — Z3483 Encounter for supervision of other normal pregnancy, third trimester: Principal | ICD-10-CM

## 2024-03-08 DIAGNOSIS — O26893 Other specified pregnancy related conditions, third trimester: Secondary | ICD-10-CM | POA: Diagnosis present

## 2024-03-08 DIAGNOSIS — O99820 Streptococcus B carrier state complicating pregnancy: Secondary | ICD-10-CM | POA: Diagnosis not present

## 2024-03-08 LAB — CBC
HCT: 36.1 % (ref 36.0–46.0)
Hemoglobin: 12 g/dL (ref 12.0–15.0)
MCH: 30.9 pg (ref 26.0–34.0)
MCHC: 33.2 g/dL (ref 30.0–36.0)
MCV: 93 fL (ref 80.0–100.0)
Platelets: 137 10*3/uL — ABNORMAL LOW (ref 150–400)
RBC: 3.88 MIL/uL (ref 3.87–5.11)
RDW: 13.1 % (ref 11.5–15.5)
WBC: 5.2 10*3/uL (ref 4.0–10.5)
nRBC: 0 % (ref 0.0–0.2)

## 2024-03-08 LAB — TYPE AND SCREEN
ABO/RH(D): A POS
Antibody Screen: NEGATIVE

## 2024-03-08 MED ORDER — MISOPROSTOL 200 MCG PO TABS
ORAL_TABLET | ORAL | Status: AC
  Filled 2024-03-08: qty 4

## 2024-03-08 MED ORDER — DOCUSATE SODIUM 100 MG PO CAPS
100.0000 mg | ORAL_CAPSULE | Freq: Two times a day (BID) | ORAL | Status: DC
  Administered 2024-03-08 – 2024-03-10 (×4): 100 mg via ORAL
  Filled 2024-03-08 (×4): qty 1

## 2024-03-08 MED ORDER — OXYTOCIN 10 UNIT/ML IJ SOLN: 10.0000 [IU] | Freq: Once | INTRAMUSCULAR | Status: DC

## 2024-03-08 MED ORDER — LACTATED RINGERS IV SOLN: 500.0000 mL | INTRAVENOUS | Status: DC | PRN

## 2024-03-08 MED ORDER — SOD CITRATE-CITRIC ACID 500-334 MG/5ML PO SOLN: 30.0000 mL | ORAL | Status: DC | PRN

## 2024-03-08 MED ORDER — BENZOCAINE-MENTHOL 20-0.5 % EX AERO
1.0000 | INHALATION_SPRAY | CUTANEOUS | Status: DC | PRN
  Administered 2024-03-08: 1 via TOPICAL
  Filled 2024-03-08: qty 56

## 2024-03-08 MED ORDER — OXYTOCIN-SODIUM CHLORIDE 30-0.9 UT/500ML-% IV SOLN: 2.5000 [IU]/h | INTRAVENOUS | Status: DC | PRN

## 2024-03-08 MED ORDER — PENICILLIN G POT IN DEXTROSE 60000 UNIT/ML IV SOLN
3.0000 10*6.[IU] | INTRAVENOUS | Status: DC
  Administered 2024-03-08: 3 10*6.[IU] via INTRAVENOUS
  Filled 2024-03-08 (×4): qty 50

## 2024-03-08 MED ORDER — TRANEXAMIC ACID-NACL 1000-0.7 MG/100ML-% IV SOLN
INTRAVENOUS | Status: AC
  Administered 2024-03-08: 1000 mg
  Filled 2024-03-08: qty 100

## 2024-03-08 MED ORDER — WITCH HAZEL-GLYCERIN EX PADS: MEDICATED_PAD | CUTANEOUS | Status: DC | PRN

## 2024-03-08 MED ORDER — COCONUT OIL OIL: 1.0000 | TOPICAL_OIL | Status: DC | PRN

## 2024-03-08 MED ORDER — PRENATAL MULTIVITAMIN CH
1.0000 | ORAL_TABLET | Freq: Every day | ORAL | Status: DC
  Administered 2024-03-09 – 2024-03-10 (×2): 1 via ORAL
  Filled 2024-03-08 (×2): qty 1

## 2024-03-08 MED ORDER — AMMONIA AROMATIC IN INHA
RESPIRATORY_TRACT | Status: AC
  Filled 2024-03-08: qty 10

## 2024-03-08 MED ORDER — LIDOCAINE HCL (PF) 1 % IJ SOLN
INTRAMUSCULAR | Status: AC
  Filled 2024-03-08: qty 30

## 2024-03-08 MED ORDER — ACETAMINOPHEN 325 MG PO TABS: 650.0000 mg | ORAL_TABLET | ORAL | Status: DC | PRN

## 2024-03-08 MED ORDER — METHYLERGONOVINE MALEATE 0.2 MG/ML IJ SOLN
INTRAMUSCULAR | Status: AC
  Administered 2024-03-08: 0.2 mg
  Filled 2024-03-08: qty 1

## 2024-03-08 MED ORDER — DIPHENHYDRAMINE HCL 25 MG PO CAPS: 25.0000 mg | ORAL_CAPSULE | Freq: Four times a day (QID) | ORAL | Status: DC | PRN

## 2024-03-08 MED ORDER — OXYTOCIN BOLUS FROM INFUSION
333.0000 mL | Freq: Once | INTRAVENOUS | Status: AC
  Administered 2024-03-08: 333 mL via INTRAVENOUS

## 2024-03-08 MED ORDER — SIMETHICONE 80 MG PO CHEW: 80.0000 mg | CHEWABLE_TABLET | ORAL | Status: DC | PRN

## 2024-03-08 MED ORDER — OXYTOCIN 10 UNIT/ML IJ SOLN
INTRAMUSCULAR | Status: AC
  Filled 2024-03-08: qty 2

## 2024-03-08 MED ORDER — OXYTOCIN-SODIUM CHLORIDE 30-0.9 UT/500ML-% IV SOLN
INTRAVENOUS | Status: AC
  Filled 2024-03-08: qty 500

## 2024-03-08 MED ORDER — LACTATED RINGERS IV SOLN: INTRAVENOUS | Status: DC

## 2024-03-08 MED ORDER — ONDANSETRON HCL 4 MG/2ML IJ SOLN
4.0000 mg | Freq: Four times a day (QID) | INTRAMUSCULAR | Status: DC | PRN
  Administered 2024-03-08: 4 mg via INTRAVENOUS
  Filled 2024-03-08: qty 2

## 2024-03-08 MED ORDER — IBUPROFEN 600 MG PO TABS
600.0000 mg | ORAL_TABLET | Freq: Four times a day (QID) | ORAL | Status: DC
  Administered 2024-03-08 – 2024-03-10 (×8): 600 mg via ORAL
  Filled 2024-03-08 (×8): qty 1

## 2024-03-08 MED ORDER — LIDOCAINE HCL (PF) 1 % IJ SOLN: 30.0000 mL | INTRAMUSCULAR | Status: DC | PRN

## 2024-03-08 MED ORDER — OXYTOCIN-SODIUM CHLORIDE 30-0.9 UT/500ML-% IV SOLN: 2.5000 [IU]/h | INTRAVENOUS | Status: DC

## 2024-03-08 MED ORDER — OXYCODONE-ACETAMINOPHEN 5-325 MG PO TABS: 1.0000 | ORAL_TABLET | ORAL | Status: DC | PRN

## 2024-03-08 MED ORDER — SODIUM CHLORIDE 0.9 % IV SOLN
5.0000 10*6.[IU] | Freq: Once | INTRAVENOUS | Status: AC
  Administered 2024-03-08: 5 10*6.[IU] via INTRAVENOUS
  Filled 2024-03-08: qty 5

## 2024-03-08 MED ORDER — FENTANYL CITRATE (PF) 100 MCG/2ML IJ SOLN
INTRAMUSCULAR | Status: AC
  Filled 2024-03-08: qty 2

## 2024-03-08 MED ORDER — OXYCODONE-ACETAMINOPHEN 5-325 MG PO TABS: 2.0000 | ORAL_TABLET | ORAL | Status: DC | PRN

## 2024-03-08 MED ORDER — ACETAMINOPHEN 325 MG PO TABS
650.0000 mg | ORAL_TABLET | ORAL | Status: DC | PRN
  Administered 2024-03-08 – 2024-03-09 (×2): 650 mg via ORAL
  Filled 2024-03-08 (×2): qty 2

## 2024-03-08 NOTE — H&P (Signed)
 [facility] Labor & Delivery  History and Physical  ASSESSMENT AND PLAN   Robin Silva is a 18 y.o. G2P1001 at [redacted]w[redacted]d with EDD: 03/19/2024, by 10 week ultrasound admitted for management of spontaneous labor.  Labor - Patient in active labor on admission. Will start IV for GBS prophylaxis. Consider AROM as appropriate and with adequate GBS prophylaxis.  - Patient desires unmedicated birth. Aware of pain management options. - Anticipate SVD.  Fetal Status: - cephalic presentation by Leopold's - EFW: 7 lbs by Leopold's - Intermittent FHR monitoring - FHT currently cat I  Other Active Problems:  Anemia - s/p two IV Fe infusions.  - Admission CBC pending.   Depression - Not currently on medication.  - Plan for two week PP mood check.   Labs/Immunizations: Prenatal labs and studies: ABO, Rh: A/Positive/-- (01/28 1052) Antibody: Negative (01/28 1052) Rubella: 5.09 (01/28 1052) Varicella:    RPR: Non Reactive (03/06 1656)  HBsAg: Negative (01/28 1052)  HepC: Non Reactive (01/28 1052) HIV: Non Reactive (01/28 1052)  GBS: POSITIVE/-- (05/02 1730)   TDAP: given prenatally Flu: no  Postpartum Plan: - Feeding: Breast Milk - Contraception: plans Depo-Provera  - Prenatal Care Provider: [facility]      HPI   Chief Complaint: Contractions  [name] is a 18 y.o. G2P1001 at [redacted]w[redacted]d who presents for evaluation of labor. She reports contractions starting around 7 am this morning and increasing in frequency and intensity. She also reports some bloody show. She endorses normal fetal movement. She denies LOF.   Pregnancy Complications Patient Active Problem List   Diagnosis Date Noted   Normal labor 03/08/2024   GBS (group B Streptococcus carrier), +RV culture, currently pregnant 03/03/2024   Positive depression screening 12/19/2023   Bilateral ovarian simple cysts on 08/25/23 u/s 6.6 cm 11/18/2023   Prenatal care, subsequent pregnancy 11/12/2023    Pica ice 11/12/2023   Late prenatal care 21 5/7 11/12/2023   UTI (urinary tract infection) during pregnancy 08/25/23 E. Coli 11/12/2023   Anemia affecting pregnancy 11/12/2023   Postpartum hemorrhage EBL 700 mL 07/25/22 09/04/2022    Review of Systems A twelve point review of systems was negative except as stated in HPI.   HISTORY   Medications Medications Prior to Admission  Medication Sig Dispense Refill Last Dose/Taking   ondansetron  (ZOFRAN -ODT) 4 MG disintegrating tablet Take 1 tablet (4 mg total) by mouth every 8 (eight) hours as needed for nausea or vomiting. 30 tablet 0 Past Week   Iron , Ferrous Sulfate , 325 (65 Fe) MG TABS Take 1 tablet by mouth daily at 6 (six) AM. (Patient not taking: Reported on 01/21/2024) 100 tablet  Not Taking   prenatal vitamin w/FE, FA (NATACHEW) 29-1 MG CHEW chewable tablet Chew 1 tablet by mouth daily at 12 noon. (Patient not taking: Reported on 03/08/2024)   Not Taking    Allergies has no known allergies.   OB History OB History  Gravida Para Term Preterm AB Living  2 1 1  0 0 1  SAB IAB Ectopic Multiple Live Births  0 0 0 0 1    # Outcome Date GA Lbr Len/2nd Weight Sex Type Anes PTL Lv  2 Current           1 Term 07/25/22 [redacted]w[redacted]d / 00:12 3310 g M Vag-Spont None  LIV     Name: Robin Silva     Apgar1: 8  Apgar5: 9    Past Medical History Past Medical History:  Diagnosis Date  Anemia of mother in pregnancy, antepartum 04/02/2022   Chlamydia infection affecting pregnancy, antepartum 04/02/2022   Diagnosed at [facility] ED 01/25/22  [x]   Treated [facility] ED 01/26/22  [x]   Retested at [facility] initial appt 04/02/22   Gonorrhea in pregnancy, antepartum 04/02/2022   Diagnosed at [facility] ED  [x]   Treated 01/26/22 [facility] ED  [x]   Re-tested at [facility] initial appt 04/02/22 = neg      Headache    Infection    Labor and delivery, indication for care 02/14/2024   Late prenatal care affecting pregnancy, antepartum @ 21  wks 04/02/2022   Medical history non-contributory     Pica ice 3x/wk 04/30/2022   Supervision of high-risk pregnancy, unspecified trimester 04/02/2022          Nursing Staff  Provider  Office Location   [facility]  Dating   LMP; on 01/25/22@12  2/7=08/07/22  Language   English  Anatomy US    On 04/05/22@21  5/7=08/13/22  Flu Vaccine   Declined 04/02/22  Genetic Screen   NIPS: 04/02/22=neg  AFPonly: 04/02/22=neg:   First Screen:  Quad:      TDaP vaccine    Declined 05/14/22  Hgb A1C or   GTT  Early   Third trimester   COVID vaccine  Declined 04/02/22        [place]    Past Surgical History Past Surgical History:  Procedure Laterality Date   Denies surgical history     NO PAST SURGERIES      Social History  reports that she has never smoked. She has been exposed to tobacco smoke. She has never used smokeless tobacco. She reports that she does not currently use drugs after having used the following drugs: Marijuana. She reports that she does not drink alcohol.   Family History family history includes Asthma in her half-brother, half-sister, and half-sister; Healthy in her father, half-brother, half-sister, half-sister, maternal grandfather, mother, and paternal grandmother; Hypertension in her maternal aunt; Stroke in her maternal grandmother.   PHYSICAL EXAM   Vitals:   03/08/24 1041  Resp: 16  Temp: 98.2 F (36.8 C)  TempSrc: Oral  Weight: 66 kg  Height: 5\' 6"  (1.676 m)    Constitutional: No acute distress, well appearing, and well nourished. Neurologic: She is alert and conversational.  Psychiatric: She has a normal mood and affect.  Musculoskeletal: Normal gait, grossly normal range of motion Cardiovascular: Normal rate.   Pulmonary/Chest: Normal work of breathing.  Gastrointestinal/Abdominal: Soft. Gravid. There is no tenderness.  Skin: Skin is warm and dry. No rash noted.  Genitourinary: Normal external female genitalia.  SVE:   Dilation: 4.5 Effacement (%): 80 Cervical Position: Middle, Posterior Station: -2 Presentation: Vertex Exam by::  S Maegen Wigle CNM SSE: deferred  NST Interpretation Indication: Contractions Baseline: 140 bpm Variability: moderate Accelerations: present Decelerations: absent Contractions: regular, every 3-5 minutes Time noted:  See OBIX Impression: reactive Authenticated by: [name] Jadan Hinojos   Attending Dr. Luster Salters was immediately available for the care of the patient.   [name] Shaneen Reeser, CNM

## 2024-03-08 NOTE — Progress Notes (Signed)
 Labor Progress Note   ASSESSMENT/PLAN   Robin Silva 18 y.o.   G2P1001  at [redacted]w[redacted]d here with spontaneous labor.  FWB:  - Fetal well being assessed: Cat I      GBS: - GBS POSITIVE/-- (05/02 1730), now with adequate tx with PCN  LABOR: - Now in active labor, doing well. - Pain Management: nitrous oxide - Discussed options with patient and performed AROM to clear fluid.  - Anticipate SVD   Active Problems: Anemia - Hgb 12.0 on admission   Labor Progress: 1110 5/80/-2 1425 7/100/-1 1545 8/100/0, AROM (clr fluid)  SUBJECTIVE/OBJECTIVE   SUBJECTIVE:  Contractions becoming more intense.    OBJECTIVE: Vital Signs: Patient Vitals for the past 12 hrs:  BP Temp Temp src Pulse Resp SpO2 Height Weight  03/08/24 1504 -- -- -- -- -- 100 % -- --  03/08/24 1500 -- -- -- -- -- 100 % -- --  03/08/24 1459 -- -- -- -- -- 100 % -- --  03/08/24 1454 -- -- -- -- -- 97 % -- --  03/08/24 1449 -- -- -- -- -- 100 % -- --  03/08/24 1444 -- -- -- -- -- 100 % -- --  03/08/24 1439 -- -- -- -- -- 99 % -- --  03/08/24 1434 -- -- -- -- -- 98 % -- --  03/08/24 1343 125/76 98.1 F (36.7 C) Oral 97 -- -- -- --  03/08/24 1041 112/77 98.2 F (36.8 C) Oral (!) 101 16 -- 5\' 6"  (1.676 m) 66 kg    Last SVE:  Dilation: 8 Effacement (%): 100 Cervical Position: Middle, Posterior Station: -1 Presentation: Vertex Exam by:: Aurianna Earlywine CNM -  , Rupture Date: 03/08/24, Rupture Time: 1551,    FHR:   - Mode: External  - Baseline Rate (A): 142 bpm (fht)  -    - Characteristics (ie - accels, decels): Accelerations: 15 x 15  -    UTERINE ACTIVITY:   - Mode: Toco  - Contraction Frequency (min): 2-5 minutes

## 2024-03-08 NOTE — OB Triage Note (Signed)
 Patient began contracting at home at 7am today.  Patient  states that she started having bright red vaginal bleeding at the same time. Reports that baby has been moving normally.

## 2024-03-08 NOTE — Progress Notes (Signed)
 Labor Progress Note   ASSESSMENT/PLAN   Robin Silva 18 y.o.   G2P1001  at [redacted]w[redacted]d here with spontaneous labor.  FWB:  - Fetal well being assessed: Cat I      GBS: - GBS POSITIVE/-- (05/02 1730) , first dose of PCN at ~1200.  LABOR: - Now in active labor, doing well and requesting pain relief. - Pain Management: trying nitrous oxide - Discussed options with patient and will continue to expectantly manage. Discussed AROM once closer to adequate treatment with penicillin for GBS at around 1600.  - Anticipate SVD   Active Problems: Anemia - Hgb 12.0 on admission  Labor Progress: 1110 5/80/-2 1425 7/100/-1  SUBJECTIVE/OBJECTIVE   SUBJECTIVE:   Contractions intensifying. Has mother and grandmother at bedside. She is requesting something for pain.   OBJECTIVE: Vital Signs: Patient Vitals for the past 12 hrs:  BP Temp Temp src Pulse Resp Height Weight  03/08/24 1343 125/76 98.1 F (36.7 C) Oral 97 -- -- --  03/08/24 1041 112/77 98.2 F (36.8 C) Oral (!) 101 16 5\' 6"  (1.676 m) 66 kg    Last SVE:  Dilation: 7 Effacement (%): 100 Cervical Position: Middle, Posterior Station: -1 Presentation: Vertex Exam by:: S Monisha Siebel CNM -  ,  ,  ,    FHR:   - Mode: External  - Baseline Rate (A): 140 bpm  -    - Characteristics (ie - accels, decels): Accelerations: 15 x 15  -    UTERINE ACTIVITY:   - Mode: Toco, Palpation  - Contraction Frequency (min): 2-5 minutes

## 2024-03-08 NOTE — Discharge Summary (Signed)
 Postpartum Discharge Summary  Date of Service updated***     Patient Name: Robin Silva DOB: 02-Nov-2005 MRN: [id number]  Date of admission: 03/08/2024 Delivery date:03/08/2024 Delivering provider: Christo Silva, Robin Silva Date of discharge: 03/08/2024  Admitting diagnosis: Normal labor [O80, Z37.9] Intrauterine pregnancy: [redacted]w[redacted]d     Secondary diagnosis:  Principal Problem:   Normal labor Active Problems:   Prenatal care, subsequent pregnancy   Anemia affecting pregnancy   Positive depression screening    Discharge diagnosis: Term Pregnancy Delivered                                              Post partum procedures:{Postpartum procedures:23558} Augmentation: AROM Complications: None  Hospital course: Onset of Labor With Vaginal Delivery      18 y.o. yo G2P1001 at [redacted]w[redacted]d was admitted in Active Labor on 03/08/2024. Labor course was uncomplicated. Membrane Rupture Time/Date: 3:51 PM,03/08/2024  Delivery Method:Vaginal, Spontaneous Operative Delivery:N/A Episiotomy: None Lacerations:  None Patient had a postpartum course complicated by ***.  She is ambulating, tolerating a regular diet, passing flatus, and urinating well. Patient is discharged home in stable condition on 03/08/24.  Newborn Data: Birth date:03/08/2024 Birth time:4:30 PM Gender:Female Living status:Living Apgars: ,  Weight:   Magnesium Sulfate received: No BMZ received: No Rhophylac:N/A MMR:N/A T-DaP:Given prenatally Flu: No RSV Vaccine received: No Transfusion:{Transfusion received:30440034} Immunizations administered: Immunization History  Administered Date(s) Administered   Tdap 12/19/2023    Physical exam  Vitals:   03/08/24 1454 03/08/24 1459 03/08/24 1500 03/08/24 1504  BP:      Pulse:      Resp:      Temp:      TempSrc:      SpO2: 97% 100% 100% 100%  Weight:      Height:       General: {Exam; general:21111117} Lochia: {Desc; appropriate/inappropriate:30686::"appropriate"} Uterine  Fundus: {Desc; firm/soft:30687} Incision: {Exam; incision:21111123} DVT Evaluation: {Exam; dvt:2111122} Labs: Lab Results  Component Value Date   WBC 5.2 03/08/2024   HGB 12.0 03/08/2024   HCT 36.1 03/08/2024   MCV 93.0 03/08/2024   PLT 137 (L) 03/08/2024      Latest Ref Rng & Units 08/25/2023    7:53 PM  CMP  Glucose 70 - 99 mg/dL 409   BUN 6 - 20 mg/dL 7   Creatinine 8.11 - 9.14 mg/dL 7.82   Sodium 956 - 213 mmol/L 134   Potassium 3.5 - 5.1 mmol/L 3.3   Chloride 98 - 111 mmol/L 102   CO2 22 - 32 mmol/L 24   Calcium 8.9 - 10.3 mg/dL 9.0   Total Protein 6.5 - 8.1 g/dL 8.3   Total Bilirubin <0.8 mg/dL 1.7   Alkaline Phos 38 - 126 U/L 43   AST 15 - 41 U/L 14   ALT 0 - 44 U/L 9    Edinburgh Score:    02/21/2024   11:03 AM  Edinburgh Postnatal Depression Scale Screening Tool  I have been able to laugh and see the funny side of things. 0  I have looked forward with enjoyment to things. 1  I have blamed myself unnecessarily when things went wrong. 1  I have been anxious or worried for no good reason. 2  I have felt scared or panicky for no good reason. 1  Things have been getting on top of me. 1  I have  been so unhappy that I have had difficulty sleeping. 0  I have felt sad or miserable. 0  I have been so unhappy that I have been crying. 1  The thought of harming myself has occurred to me. 0  Edinburgh Postnatal Depression Scale Total 7      After visit meds:  Allergies as of 03/08/2024   No Known Allergies   Med Rec must be completed prior to using this [facility]***        Discharge home in stable condition Infant Feeding: {Baby feeding:23562} Infant Disposition:{CHL IP OB HOME WITH BMWUXL:24401} Discharge instruction: per After Visit Summary and Postpartum booklet. Activity: Advance as tolerated. Pelvic rest for 6 weeks.  Diet: routine diet Anticipated Birth Control: PP Depo given Postpartum Appointment:6 weeks Additional Postpartum F/U: Postpartum  Depression checkup Future Appointments: Future Appointments  Date Time Provider Department Center  03/11/2024 10:20 AM [facility]MH PROVIDER AC-MAT None  03/16/2024 10:30 AM [facility] LAB [facility] None  03/17/2024 10:30 AM Robin Dials, MD [facility] None  03/17/2024 11:00 AM [facility]- [facility] INFUSION CHAIR 16 [facility] None   Follow up Visit:  Follow-up Information     Robin Silva, Robin Silva, CNM Follow up.   Specialty: Obstetrics and Gynecology Why: In two weeks for telehealth mood check and in 6 weeks for PP visit. Contact information: [address] [place] [address] [phone]                     03/08/2024 Robin Silva Robin Silva, CNM

## 2024-03-08 NOTE — Plan of Care (Signed)
Post Partum 

## 2024-03-09 LAB — CBC
HCT: 27.9 % — ABNORMAL LOW (ref 36.0–46.0)
Hemoglobin: 9.3 g/dL — ABNORMAL LOW (ref 12.0–15.0)
MCH: 30.8 pg (ref 26.0–34.0)
MCHC: 33.3 g/dL (ref 30.0–36.0)
MCV: 92.4 fL (ref 80.0–100.0)
Platelets: 129 10*3/uL — ABNORMAL LOW (ref 150–400)
RBC: 3.02 MIL/uL — ABNORMAL LOW (ref 3.87–5.11)
RDW: 12.9 % (ref 11.5–15.5)
WBC: 8.6 10*3/uL (ref 4.0–10.5)
nRBC: 0 % (ref 0.0–0.2)

## 2024-03-09 LAB — RPR: RPR Ser Ql: NONREACTIVE

## 2024-03-09 NOTE — Discharge Instructions (Signed)

## 2024-03-09 NOTE — Progress Notes (Signed)
 [name] Subjective:  Doing well postpartum day 1; sleeping and woke easily upon entering room, tolerating regular diet, pain is controlled with PO medication, ambulating and voiding without difficulty. Formula and breastfeeding.  Objective:  Vital signs in last 24 hours: Temp:  [97.6 F (36.4 C)-99.1 F (37.3 C)] 98.8 F (37.1 C) (05/26 0748) Pulse Rate:  [70-119] 70 (05/26 0748) Resp:  [14-18] 17 (05/26 0748) BP: (98-126)/(57-78) 114/75 (05/26 0748) SpO2:  [95 %-100 %] 98 % (05/26 0748) Weight:  [66 kg] 66 kg (05/25 1041)    General: NAD Pulmonary: no increased work of breathing Abdomen: non-distended, non-tender, fundus firm at level of umbilicus Extremities: no edema, no erythema, no tenderness  Results for orders placed or performed during the hospital encounter of 03/08/24 (from the past 72 hours)  CBC     Status: Abnormal   Collection Time: 03/08/24 12:02 PM  Result Value Ref Range   WBC 5.2 4.0 - 10.5 K/uL   RBC 3.88 3.87 - 5.11 MIL/uL   Hemoglobin 12.0 12.0 - 15.0 g/dL   HCT 53.6 64.4 - 03.4 %   MCV 93.0 80.0 - 100.0 fL   MCH 30.9 26.0 - 34.0 pg   MCHC 33.2 30.0 - 36.0 g/dL   RDW 74.2 59.5 - 63.8 %   Platelets 137 (L) 150 - 400 K/uL   nRBC 0.0 0.0 - 0.2 %    Comment: Performed at [facility], [address] [place], [place] [address]  Type and screen [facility]     Status: None   Collection Time: 03/08/24 12:02 PM  Result Value Ref Range   ABO/RH(D) A POS    Antibody Screen NEG    Sample Expiration      03/11/2024,2359 Performed at [facility], [address] [place], [place] [address]   CBC     Status: Abnormal   Collection Time: 03/09/24  5:15 AM  Result Value Ref Range   WBC 8.6 4.0 - 10.5 K/uL   RBC 3.02 (L) 3.87 - 5.11 MIL/uL   Hemoglobin 9.3 (L) 12.0 - 15.0 g/dL   HCT 88.4 (L) 16.6 - 06.3 %   MCV 92.4 80.0 - 100.0 fL   MCH 30.8 26.0 - 34.0 pg   MCHC 33.3 30.0 - 36.0 g/dL   RDW 01.6 01.0 - 93.2  %   Platelets 129 (L) 150 - 400 K/uL   nRBC 0.0 0.0 - 0.2 %    Comment: Performed at [facility], [address] [place], [place] [address]    Assessment:   18 y.o. G2P1001 postpartum day # 1, lactating  Plan:    1) Acute blood loss anemia - hemodynamically stable and asymptomatic - po ferrous sulfate , not clinically significant for this admission  2) Blood Type --/--/A POS (05/25 1202) / Rubella 5.09 (01/28 1052) / Varicella Unknown  3) TDAP status given antepartum  4) Feeding plan breast and formula  5)  Education given regarding options for contraception, as well as compatibility with breast feeding if applicable.  Patient plans on Depo-Provera  injections for contraception.  6) Disposition: continue current care, may want to discharge to home later today   Angelita Kendall, CNM  Ob/Gyn [facility] 03/09/2024 9:07 AM

## 2024-03-09 NOTE — Lactation Note (Signed)
 This note was copied from a baby's chart. Lactation Consultation Note  Patient Name: Robin Silva [id number] Date: 03/09/2024 Age:19 hours Reason for consult: Follow-up assessment   Maternal Data Has patient been taught Hand Expression?: Yes Does the patient have breastfeeding experience prior to this delivery?: No (formula fed first child, did not attempt breastfeeding)  Feeding Mother's Current Feeding Choice: Breast Milk and Formula Nipple Type: Slow - flow Mom had expressed a desire earlier to pump her breasts, Medela breast pump kit and Symphony pump brought to room, offered assistance to help initiate pumping, mom desires to wait   LATCH Score Latch: Repeated attempts needed to sustain latch, nipple held in mouth throughout feeding, stimulation needed to elicit sucking reflex.  Audible Swallowing: None  Type of Nipple: Flat  Comfort (Breast/Nipple): Soft / non-tender  Hold (Positioning): Assistance needed to correctly position infant at breast and maintain latch.  LATCH Score: 5   Lactation Tools Discussed/Used    Interventions Interventions: DEBP (offered assist with DEBP, mom does not want to pump now)  Discharge [facility] Program: Yes  Consult Status Consult Status: PRN Date: 03/10/24 Follow-up type: In-patient    Leoma Raja 03/09/2024, 4:03 PM

## 2024-03-09 NOTE — Lactation Note (Addendum)
 This note was copied from a baby's chart. Lactation Consultation Note  Patient Name: Robin Silva [id number] Date: 03/09/2024 Age:19 hours Initial assessment, late in prenatal care, 38 wk, 4 days   Maternal Data Has patient been taught Hand Expression?: Yes Does the patient have breastfeeding experience prior to this delivery?: No (formula fed first child, did not attempt breastfeeding) Late prenatal care  Feeding Mother's Current Feeding Choice: Breast Milk and Formula Mom requested assist with breastfeeding, assisted mom with turning to left side and baby beside her, nipples sl flat but breast soft and can be shaped, assisted with hand expression, few drops of colostrum obtained, after few attempts baby did latch to breast with few sucks, then held breast in mouth, came off breast and then assisted with latching again, several attempts made with baby fussy and attempting to coordinate latch, tends to push nipple out with tongue, but was able to latch and nursed off and approx 2-3 min., came off breast and mom then began having uterine cramps and became nauseous, mom then vomited in trash can, mod amt clear fluid.  Mom then desired to stop attempts at breastfeeding.  The grandmother then formula fed baby 10 cc Similac   LATCH Score Latch: Repeated attempts needed to sustain latch, nipple held in mouth throughout feeding, stimulation needed to elicit sucking reflex.  Audible Swallowing: None  Type of Nipple: Flat  Comfort (Breast/Nipple): Soft / non-tender  Hold (Positioning): Assistance needed to correctly position infant at breast and maintain latch.  LATCH Score: 5   Lactation Tools Discussed/Used    Interventions Interventions: Breast feeding basics reviewed;Assisted with latch;Hand express;Adjust position;Breast compression;Support pillows;Education LC name and no written on white board Discharge WIC Program: Yes Does not have a breast pump Consult Status Consult Status:  Follow-up Date: 03/09/24 Follow-up type: In-patient    Leoma Raja 03/09/2024, 1:37 PM

## 2024-03-10 MED ORDER — BENZOCAINE-MENTHOL 20-0.5 % EX AERO: 1.0000 | INHALATION_SPRAY | CUTANEOUS | Status: AC | PRN

## 2024-03-10 MED ORDER — IBUPROFEN 600 MG PO TABS: 600.0000 mg | ORAL_TABLET | Freq: Four times a day (QID) | ORAL | 0 refills | Status: AC

## 2024-03-10 MED ORDER — ACETAMINOPHEN 325 MG PO TABS: 650.0000 mg | ORAL_TABLET | ORAL | Status: AC | PRN

## 2024-03-10 MED ORDER — WITCH HAZEL-GLYCERIN EX PADS: MEDICATED_PAD | CUTANEOUS | 12 refills | Status: AC | PRN

## 2024-03-10 NOTE — Progress Notes (Signed)
 Patient discharged. Discharge instructions given. Patient verbalizes understanding. Transported by axillary.

## 2024-03-11 ENCOUNTER — Ambulatory Visit

## 2024-03-13 ENCOUNTER — Other Ambulatory Visit: Payer: Self-pay | Admitting: *Deleted

## 2024-03-13 DIAGNOSIS — O99013 Anemia complicating pregnancy, third trimester: Secondary | ICD-10-CM

## 2024-03-16 ENCOUNTER — Inpatient Hospital Stay: Attending: Oncology

## 2024-03-16 DIAGNOSIS — D509 Iron deficiency anemia, unspecified: Secondary | ICD-10-CM | POA: Insufficient documentation

## 2024-03-17 ENCOUNTER — Inpatient Hospital Stay: Admitting: Oncology

## 2024-03-17 ENCOUNTER — Inpatient Hospital Stay

## 2024-03-23 ENCOUNTER — Inpatient Hospital Stay

## 2024-03-23 DIAGNOSIS — O99013 Anemia complicating pregnancy, third trimester: Secondary | ICD-10-CM

## 2024-03-23 DIAGNOSIS — D509 Iron deficiency anemia, unspecified: Secondary | ICD-10-CM | POA: Diagnosis present

## 2024-03-23 LAB — CBC WITH DIFFERENTIAL/PLATELET
Abs Immature Granulocytes: 0.01 10*3/uL (ref 0.00–0.07)
Basophils Absolute: 0 10*3/uL (ref 0.0–0.1)
Basophils Relative: 0 %
Eosinophils Absolute: 0.1 10*3/uL (ref 0.0–0.5)
Eosinophils Relative: 3 %
HCT: 35 % — ABNORMAL LOW (ref 36.0–46.0)
Hemoglobin: 11.4 g/dL — ABNORMAL LOW (ref 12.0–15.0)
Immature Granulocytes: 0 %
Lymphocytes Relative: 45 %
Lymphs Abs: 1.8 10*3/uL (ref 0.7–4.0)
MCH: 30.6 pg (ref 26.0–34.0)
MCHC: 32.6 g/dL (ref 30.0–36.0)
MCV: 93.8 fL (ref 80.0–100.0)
Monocytes Absolute: 0.4 10*3/uL (ref 0.1–1.0)
Monocytes Relative: 11 %
Neutro Abs: 1.6 10*3/uL — ABNORMAL LOW (ref 1.7–7.7)
Neutrophils Relative %: 41 %
Platelets: 265 10*3/uL (ref 150–400)
RBC: 3.73 MIL/uL — ABNORMAL LOW (ref 3.87–5.11)
RDW: 11.8 % (ref 11.5–15.5)
WBC: 3.9 10*3/uL — ABNORMAL LOW (ref 4.0–10.5)
nRBC: 0 % (ref 0.0–0.2)

## 2024-03-23 LAB — IRON AND TIBC
Iron: 61 ug/dL (ref 28–170)
Saturation Ratios: 18 % (ref 10.4–31.8)
TIBC: 344 ug/dL (ref 250–450)
UIBC: 283 ug/dL

## 2024-03-23 LAB — FERRITIN: Ferritin: 45 ng/mL (ref 11–307)

## 2024-03-24 ENCOUNTER — Inpatient Hospital Stay

## 2024-03-24 ENCOUNTER — Inpatient Hospital Stay: Admitting: Oncology

## 2024-03-26 ENCOUNTER — Encounter: Payer: Self-pay | Admitting: Oncology

## 2024-03-26 ENCOUNTER — Telehealth: Payer: Self-pay

## 2024-03-26 ENCOUNTER — Inpatient Hospital Stay

## 2024-03-26 ENCOUNTER — Inpatient Hospital Stay (HOSPITAL_BASED_OUTPATIENT_CLINIC_OR_DEPARTMENT_OTHER): Admitting: Oncology

## 2024-03-26 DIAGNOSIS — D649 Anemia, unspecified: Secondary | ICD-10-CM

## 2024-03-26 NOTE — Telephone Encounter (Signed)
 Patient said she was unable to wait her apt was at 1045 and at 1140 patient was still not seen by provider. Apt rescheduled. Director aware

## 2024-03-27 ENCOUNTER — Encounter: Payer: Self-pay | Admitting: Oncology

## 2024-03-27 NOTE — Progress Notes (Signed)
 This encounter was created in error - please disregard.

## 2024-03-30 ENCOUNTER — Encounter: Payer: Self-pay | Admitting: Oncology

## 2024-03-30 ENCOUNTER — Inpatient Hospital Stay: Admitting: Oncology

## 2024-03-30 ENCOUNTER — Inpatient Hospital Stay

## 2024-04-22 ENCOUNTER — Encounter: Payer: Self-pay | Admitting: Family Medicine

## 2024-04-22 ENCOUNTER — Ambulatory Visit: Admitting: Family Medicine

## 2024-04-22 DIAGNOSIS — Z3009 Encounter for other general counseling and advice on contraception: Secondary | ICD-10-CM

## 2024-04-22 DIAGNOSIS — Z30013 Encounter for initial prescription of injectable contraceptive: Secondary | ICD-10-CM | POA: Diagnosis not present

## 2024-04-22 LAB — WET PREP FOR TRICH, YEAST, CLUE
Clue Cell Exam: NEGATIVE
Trichomonas Exam: NEGATIVE
Yeast Exam: NEGATIVE

## 2024-04-22 LAB — HEMOGLOBIN: Hemoglobin: 12.9 g/dL (ref 11.1–15.9)

## 2024-04-22 MED ORDER — MEDROXYPROGESTERONE ACETATE 150 MG/ML IM SUSY: 150.0000 mg | PREFILLED_SYRINGE | Freq: Once | INTRAMUSCULAR | Status: DC

## 2024-04-22 MED ORDER — MEDROXYPROGESTERONE ACETATE 150 MG/ML IM SUSY: PREFILLED_SYRINGE | Freq: Once | INTRAMUSCULAR | Status: AC

## 2024-04-22 NOTE — Progress Notes (Signed)
 Pt is here for family planning visit.  Family planning education card reviewed and given to pt.  Wet prep results reviewed, no treatment required per standing orders. Condoms given. Depo given and tolerated in L deltoid, reminder card given. Larraine JONELLE Northern, RN

## 2024-04-22 NOTE — Progress Notes (Signed)
 [facility] [address], [address] [place] [address] Main phone: [phone]  Postpartum Visit  Subjective:  Robin Silva is a 18 y.o. G85P2002 female who presents for a postpartum visit. She is 6 weeks postpartum following a normal spontaneous vaginal delivery.  I have fully reviewed the prenatal and intrapartum course. Postpartum course has been unremarkable.  Patient Active Problem List   Diagnosis Date Noted   Normal labor 03/08/2024   GBS (group B Streptococcus carrier), +RV culture, currently pregnant 03/03/2024   Positive depression screening 12/19/2023   Bilateral ovarian simple cysts on 08/25/23 u/s 6.6 cm 11/18/2023   Prenatal care, subsequent pregnancy 11/12/2023   Pica ice 11/12/2023   Late prenatal care 21 5/7 11/12/2023   UTI (urinary tract infection) during pregnancy 08/25/23 E. Coli 11/12/2023   Anemia affecting pregnancy 11/12/2023   Postpartum hemorrhage EBL 700 mL 07/25/22 09/04/2022   Delivery The delivery was at [redacted]w[redacted]d gestational weeks.   Anesthesia: none.  Delivery complications: none Patient describes her labor and delivery as really good.  Infant Baby is doing well. Baby is feeding by bottle - formula.  GI & GU Bleeding: no bleeding.  Bowel function is normal.  Bladder function is normal.   Sexuality and Contraception Patient is sexually active.  Contraception method is none.   The pregnancy intention screening data noted above was reviewed. Potential methods of contraception were discussed. The patient elected to proceed with Hormonal Injection.  Mood Postpartum depression screening: negative. Flowsheet Row Initial Prenatal from 04/02/2022 in [facility] Department  PHQ-9 Total Score 8    Health Maintenance Health Maintenance Due  Topic Date Due   Hepatitis B Vaccines (1 of 3 - 3-dose series) Never done   HPV VACCINES (1 - 3-dose series) Never done    Meningococcal B Vaccine (1 of 2 - Standard) Never done   COVID-19 Vaccine (1 - 2024-25 season) Never done   DTaP/Tdap/Td (2 - Td or Tdap) 01/16/2024   The following portions of the patient's history were reviewed and updated as appropriate: allergies, current medications, past family history, past medical history, past social history, past surgical history, and problem list.  Review of Systems Pertinent items are noted in HPI.  Objective:  BP 130/87 (BP Location: Right Arm, Patient Position: Sitting, Cuff Size: Normal)   Pulse 65   LMP 04/19/2024   Breastfeeding No    General:  alert, cooperative, and appears stated age   Breasts:  not indicated  Lungs: clear to auscultation bilaterally  Heart:  regular rate and rhythm, S1, S2 normal, no murmur, click, rub or gallop  Abdomen: soft, non-tender; bowel sounds normal; no masses,  no organomegaly   Wound N/a  GU exam:  normal       Assessment:    1. Postpartum exam - Recovering well after vaginal birth - Mood is good, no anxiety/depression sx - Has resumed sexual activity and had no issues/pain - Menses has returned, 04/19/24  Postpartum exam -     Hemoglobin -     Chlamydia/Gonorrhea [facility] -     WET PREP FOR TRICH, YEAST, CLUE -     medroxyPROGESTERone  Acetate  Family planning  Normal postpartum exam.   Plan:   Essential components of care per ACOG recommendations:  1.  Mood and well being: Patient with negative depression screening today.  - Patient tobacco use? No.   - hx of drug use? Previous MJ use  2. Infant  care and feeding:  -Patient currently breastmilk feeding? No.  -Social determinants of health (SDOH) reviewed in EPIC. No concerns.   3. Sexuality, contraception and birth spacing - Patient does not want a pregnancy in the next year.  Desired family size is 2 children.  - Reviewed reproductive life planning. Reviewed options based on patient desire and reproductive life plan. Patient is interested in  Hormonal Implant and Hormonal Injection. The hormonal injection was provided to the patient today, patient desires to return for Nexplanon insertion at later date.  Risks, benefits, and typical effectiveness rates were reviewed.  Questions were answered.  Written information was also given to the patient to review.    The patient will follow up in  3 months for depo OR Nexplanon as desired. The patient was told to call with any further questions, or with any concerns about this method of contraception.  Emphasized use of condoms 100% of the time for STI prevention.  Emergency Contraception Precautions (ECP): Patient assessed for need of ECP. She is not a candidate based on no intercourse since LMP.  - Discussed birth spacing of 18 months  4. Sleep and fatigue -Encouraged family/partner/community support of 4 hrs of uninterrupted sleep to help with mood and fatigue  5. Physical Recovery  - Discussed patients delivery and complications. She describes her labor as good. - Patient had a Vaginal, no problems at delivery. Patient had no laceration.  - Patient has urinary incontinence? No. - Patient is safe to resume physical and sexual activity  6.  Health Maintenance - HM due items addressed Yes - Last pap smear: start at 21 - Pap smear not done at today's visit.  -Breast Cancer screening indicated? No.   7. Chronic Disease/Pregnancy Condition follow up: Anemia, hemoglobin drawn today  - PCP follow up  Damien Satchel, NP  Attestation of Attending Supervision of Advanced Practice Provider (CNM/NP/PA):  Evaluation, management, and procedures were performed by the Advanced Practice Provider under my supervision and collaboration.  I have reviewed the Advanced Practice Provider's note and chart, and I agree with the management and plan.  Dorothyann Helling, MD Clinical Services Medical Director [facility] 04/29/24  6:23 PM
